# Patient Record
Sex: Female | Born: 1995 | Hispanic: No | Marital: Single | Smoking: Never smoker
Health system: Southern US, Community
[De-identification: ages and names within clinical notes are randomized; demographics above are authoritative.]

---

## 2016-10-13 ENCOUNTER — Emergency Department (HOSPITAL_COMMUNITY): Payer: BLUE CROSS/BLUE SHIELD

## 2016-10-13 ENCOUNTER — Encounter (HOSPITAL_COMMUNITY): Payer: Self-pay | Admitting: Emergency Medicine

## 2016-10-13 ENCOUNTER — Emergency Department (HOSPITAL_COMMUNITY)
Admission: EM | Admit: 2016-10-13 | Discharge: 2016-10-13 | Disposition: A | Discharge status: Home / Self Care | Payer: BLUE CROSS/BLUE SHIELD | Attending: Emergency Medicine | Admitting: Emergency Medicine

## 2016-10-13 DIAGNOSIS — F10120 Alcohol abuse with intoxication, uncomplicated: Secondary | ICD-10-CM | POA: Insufficient documentation

## 2016-10-13 DIAGNOSIS — F10920 Alcohol use, unspecified with intoxication, uncomplicated: Secondary | ICD-10-CM

## 2016-10-13 DIAGNOSIS — Z5181 Encounter for therapeutic drug level monitoring: Secondary | ICD-10-CM | POA: Diagnosis not present

## 2016-10-13 LAB — CBG MONITORING, ED: GLUCOSE-CAPILLARY: 125 mg/dL — AB (ref 65–99)

## 2016-10-13 LAB — CBC WITH DIFFERENTIAL/PLATELET
Basophils Absolute: 0.1 10*3/uL (ref 0.0–0.1)
Basophils Relative: 0 %
EOS ABS: 0.2 10*3/uL (ref 0.0–0.7)
EOS PCT: 1 %
HCT: 38.2 % (ref 36.0–46.0)
Hemoglobin: 13 g/dL (ref 12.0–15.0)
LYMPHS ABS: 3.7 10*3/uL (ref 0.7–4.0)
Lymphocytes Relative: 30 %
MCH: 29.5 pg (ref 26.0–34.0)
MCHC: 34 g/dL (ref 30.0–36.0)
MCV: 86.8 fL (ref 78.0–100.0)
MONO ABS: 0.5 10*3/uL (ref 0.1–1.0)
Monocytes Relative: 4 %
Neutro Abs: 8 10*3/uL — ABNORMAL HIGH (ref 1.7–7.7)
Neutrophils Relative %: 65 %
PLATELETS: 266 10*3/uL (ref 150–400)
RBC: 4.4 MIL/uL (ref 3.87–5.11)
RDW: 12.9 % (ref 11.5–15.5)
WBC: 12.5 10*3/uL — AB (ref 4.0–10.5)

## 2016-10-13 LAB — COMPREHENSIVE METABOLIC PANEL
ALT: 11 U/L — ABNORMAL LOW (ref 14–54)
ANION GAP: 10 (ref 5–15)
AST: 22 U/L (ref 15–41)
Albumin: 4 g/dL (ref 3.5–5.0)
Alkaline Phosphatase: 73 U/L (ref 38–126)
BUN: 9 mg/dL (ref 6–20)
CO2: 21 mmol/L — AB (ref 22–32)
Calcium: 8.5 mg/dL — ABNORMAL LOW (ref 8.9–10.3)
Chloride: 106 mmol/L (ref 101–111)
Creatinine, Ser: 0.52 mg/dL (ref 0.44–1.00)
GFR calc Af Amer: >60 mL/min (ref >60)
GFR calc non Af Amer: >60 mL/min (ref >60)
GLUCOSE: 138 mg/dL — AB (ref 65–99)
POTASSIUM: 3.1 mmol/L — AB (ref 3.5–5.1)
SODIUM: 137 mmol/L (ref 135–145)
TOTAL PROTEIN: 7.2 g/dL (ref 6.5–8.1)
Total Bilirubin: 0.5 mg/dL (ref 0.3–1.2)

## 2016-10-13 LAB — I-STAT CHEM 8, ED
BUN: 7 mg/dL (ref 6–20)
CALCIUM ION: 1 mmol/L — AB (ref 1.15–1.40)
CHLORIDE: 105 mmol/L (ref 101–111)
Creatinine, Ser: 0.7 mg/dL (ref 0.44–1.00)
Glucose, Bld: 140 mg/dL — ABNORMAL HIGH (ref 65–99)
HCT: 39 % (ref 36.0–46.0)
HEMOGLOBIN: 13.3 g/dL (ref 12.0–15.0)
Potassium: 3.1 mmol/L — ABNORMAL LOW (ref 3.5–5.1)
SODIUM: 140 mmol/L (ref 135–145)
TCO2: 22 mmol/L (ref 0–100)

## 2016-10-13 LAB — RAPID URINE DRUG SCREEN, HOSP PERFORMED
Amphetamines: NOT DETECTED
Barbiturates: NOT DETECTED
Benzodiazepines: NOT DETECTED
COCAINE: NOT DETECTED
OPIATES: NOT DETECTED
Tetrahydrocannabinol: NOT DETECTED

## 2016-10-13 LAB — ACETAMINOPHEN LEVEL

## 2016-10-13 LAB — I-STAT BETA HCG BLOOD, ED (MC, WL, AP ONLY): I-stat hCG, quantitative: <5 m[IU]/mL (ref <5)

## 2016-10-13 LAB — ETHANOL: Alcohol, Ethyl (B): 213 mg/dL — ABNORMAL HIGH (ref <5)

## 2016-10-13 LAB — SALICYLATE LEVEL: Salicylate Lvl: <7 mg/dL (ref 2.8–30.0)

## 2016-10-13 MED ORDER — ONDANSETRON HCL 4 MG/2ML IJ SOLN: 4.0000 mg | Freq: Once | INTRAMUSCULAR | Status: DC

## 2016-10-13 MED ORDER — ONDANSETRON HCL 4 MG/2ML IJ SOLN
4.0000 mg | Freq: Once | INTRAMUSCULAR | Status: AC
  Administered 2016-10-13: 4 mg via INTRAVENOUS
  Filled 2016-10-13: qty 2

## 2016-10-13 MED ORDER — SODIUM CHLORIDE 0.9 % IV BOLUS (SEPSIS)
1000.0000 mL | Freq: Once | INTRAVENOUS | Status: AC
  Administered 2016-10-13: 1000 mL via INTRAVENOUS

## 2016-10-13 NOTE — ED Notes (Signed)
Bed: [id number] Expected date:  Expected time:  Means of arrival:  Comments: 20yo ETOH

## 2016-10-13 NOTE — ED Notes (Signed)
Pt ambulated to restroom without assistance.

## 2016-10-13 NOTE — ED Notes (Signed)
Pt has been drinking ice water---- tolerating well.  Pt denies nausea.

## 2016-10-13 NOTE — ED Provider Notes (Signed)
[facility]-EMERGENCY DEPT Provider Note   CSN: [id number] Arrival date & time: 10/13/16  0134  By signing my name below, I, Sheryl Roy, attest that this documentation has been prepared under the direction and in the presence of Sheryl Cercone, MD. Electronically Signed: Doreatha Roy, ED Scribe. 10/13/16. 1:49 AM.     History   Chief Complaint Chief Complaint  Patient presents with  . Alcohol Intoxication   LEVEL 5 CAVEAT: HPI and ROS limited due to AMS  HPI Sheryl Roy is a 21 y.o. female brought in by friend who presents to the Emergency Department d/t alcohol intoxication and AMS that began tonight. Per friend, she picked the pt up at a taco shop tonight after a birthday party after an apartment.  Friend states that the friend has been vomiting since she picked her up. Per friend, the pt likely drank beer and liquor at the party. Illicit substance use is not known.   The history is provided by the patient and a friend. The history is limited by the condition of the patient. No language interpreter was used.  Alcohol Intoxication  This is a new problem. The current episode started 1 to 2 hours ago. The problem occurs constantly. The problem has not changed since onset.Pertinent negatives include no chest pain, no abdominal pain, no headaches and no shortness of breath. Nothing aggravates the symptoms. Nothing relieves the symptoms. She has tried nothing for the symptoms. The treatment provided no relief.    History reviewed. No pertinent past medical history.  There are no active problems to display for this patient.   History reviewed. No pertinent surgical history.  OB History    No data available       Home Medications    Prior to Admission medications   Not on File    Family History History reviewed. No pertinent family history.  Social History Social History  Substance Use Topics  . Smoking status: Never Smoker  . Smokeless tobacco: Never Used  . Alcohol use  Yes     Allergies   Patient has no known allergies.   Review of Systems Review of Systems  Unable to perform ROS: Other  Respiratory: Negative for shortness of breath.   Cardiovascular: Negative for chest pain.  Gastrointestinal: Negative for abdominal pain.  Neurological: Negative for headaches.     Physical Exam Updated Vital Signs There were no vitals taken for this visit.  Physical Exam  Constitutional: She appears well-developed and well-nourished.  Cold peripherally. Somnolent.   HENT:  Head: Normocephalic and atraumatic.  Mouth/Throat: Oropharynx is clear and moist. No oropharyngeal exudate.  Moist mucous membranes. No exudates.   Eyes: Conjunctivae are normal.  Dilated pupils.   Neck: Normal range of motion. Neck supple. No JVD present. No tracheal deviation present.  No carotid bruits. Trachea midline.   Cardiovascular: Normal rate, regular rhythm, normal heart sounds and intact distal pulses.  Exam reveals no gallop and no friction rub.   No murmur heard. RRR.   Pulmonary/Chest: Effort normal and breath sounds normal. No stridor. No respiratory distress. She has no wheezes. She has no rales.  Lungs CTA bilaterally.   Abdominal: Soft. Bowel sounds are normal. She exhibits no distension and no mass. There is no tenderness. There is no rebound and no guarding.  Musculoskeletal: Normal range of motion.  Lymphadenopathy:    She has no cervical adenopathy.  Neurological: She has normal reflexes. She displays normal reflexes. GCS eye subscore is 4. GCS verbal subscore is  3. GCS motor subscore is 6.  Skin: Skin is warm and dry. Capillary refill takes less than 2 seconds.  Psychiatric: She has a normal mood and affect.  Nursing note and vitals reviewed.    ED Treatments / Results   Vitals:   10/13/16 0330 10/13/16 0529  BP: 105/63 110/70  Pulse: 88 84  Resp: 22 18  Temp:  (S) 97.6 F (36.4 C)     COORDINATION OF CARE: 1:42 AM Discussed treatment plan  with friend at bedside which includes labs and she agreed to plan.    Results for orders placed or performed during the hospital encounter of 10/13/16  Comprehensive metabolic panel  Result Value Ref Range   Sodium 137 135 - 145 mmol/L   Potassium 3.1 (L) 3.5 - 5.1 mmol/L   Chloride 106 101 - 111 mmol/L   CO2 21 (L) 22 - 32 mmol/L   Glucose, Bld 138 (H) 65 - 99 mg/dL   BUN 9 6 - 20 mg/dL   Creatinine, Ser 1.910.52 0.44 - 1.00 mg/dL   Calcium 8.5 (L) 8.9 - 10.3 mg/dL   Total Protein 7.2 6.5 - 8.1 g/dL   Albumin 4.0 3.5 - 5.0 g/dL   AST 22 15 - 41 U/L   ALT 11 (L) 14 - 54 U/L   Alkaline Phosphatase 73 38 - 126 U/L   Total Bilirubin 0.5 0.3 - 1.2 mg/dL   GFR calc non Af Amer >60 >60 mL/min   GFR calc Af Amer >60 >60 mL/min   Anion gap 10 5 - 15  Ethanol  Result Value Ref Range   Alcohol, Ethyl (B) 213 (H) <5 mg/dL  Salicylate level  Result Value Ref Range   Salicylate Lvl <7.0 2.8 - 30.0 mg/dL  Acetaminophen level  Result Value Ref Range   Acetaminophen (Tylenol), Serum <10 (L) 10 - 30 ug/mL  CBC with Differential/Platelet  Result Value Ref Range   WBC 12.5 (H) 4.0 - 10.5 K/uL   RBC 4.40 3.87 - 5.11 MIL/uL   Hemoglobin 13.0 12.0 - 15.0 g/dL   HCT 47.838.2 29.536.0 - 62.146.0 %   MCV 86.8 78.0 - 100.0 fL   MCH 29.5 26.0 - 34.0 pg   MCHC 34.0 30.0 - 36.0 g/dL   RDW 30.812.9 65.711.5 - 84.615.5 %   Platelets 266 150 - 400 K/uL   Neutrophils Relative % 65 %   Neutro Abs 8.0 (H) 1.7 - 7.7 K/uL   Lymphocytes Relative 30 %   Lymphs Abs 3.7 0.7 - 4.0 K/uL   Monocytes Relative 4 %   Monocytes Absolute 0.5 0.1 - 1.0 K/uL   Eosinophils Relative 1 %   Eosinophils Absolute 0.2 0.0 - 0.7 K/uL   Basophils Relative 0 %   Basophils Absolute 0.1 0.0 - 0.1 K/uL  Rapid urine drug screen (hospital performed)  Result Value Ref Range   Opiates NONE DETECTED NONE DETECTED   Cocaine NONE DETECTED NONE DETECTED   Benzodiazepines NONE DETECTED NONE DETECTED   Amphetamines NONE DETECTED NONE DETECTED    Tetrahydrocannabinol NONE DETECTED NONE DETECTED   Barbiturates NONE DETECTED NONE DETECTED  CBG monitoring, ED  Result Value Ref Range   Glucose-Capillary 125 (H) 65 - 99 mg/dL  I-Stat beta hCG blood, ED  Result Value Ref Range   I-stat hCG, quantitative <5.0 <5 mIU/mL   Comment 3          I-Stat Chem 8, ED  Result Value Ref Range   Sodium 140 135 -  145 mmol/L   Potassium 3.1 (L) 3.5 - 5.1 mmol/L   Chloride 105 101 - 111 mmol/L   BUN 7 6 - 20 mg/dL   Creatinine, Ser 9.14 0.44 - 1.00 mg/dL   Glucose, Bld 782 (H) 65 - 99 mg/dL   Calcium, Ion 9.56 (L) 1.15 - 1.40 mmol/L   TCO2 22 0 - 100 mmol/L   Hemoglobin 13.3 12.0 - 15.0 g/dL   HCT 21.3 08.6 - 57.8 %   Dg Chest Portable 1 View  Result Date: 10/13/2016 CLINICAL DATA:  Alcohol intoxication, altered mental status, vomiting. EXAM: PORTABLE CHEST 1 VIEW COMPARISON:  None. FINDINGS: The heart size and mediastinal contours are within normal limits. Both lungs are clear. The visualized skeletal structures are unremarkable. IMPRESSION: No active disease. Electronically Signed   By: Burman Nieves M.D.   On: 10/13/2016 02:45    Procedures Procedures (including critical care time)  Medications Ordered in ED Medications  ondansetron (ZOFRAN) injection 4 mg (4 mg Intravenous Not Given 10/13/16 0200)  ondansetron (ZOFRAN) injection 4 mg (4 mg Intravenous Given 10/13/16 0200)  sodium chloride 0.9 % bolus 1,000 mL (0 mLs Intravenous Stopped 10/13/16 0318)      PO challenged and ambulated successfully.   Final Clinical Impressions(s) / ED Diagnoses  Alcohol intoxication: All questions answered to patient's satisfaction. Based on history and exam patient has been appropriately medically screened and emergency conditions excluded. Patient is stable for discharge at this time. Strict return precautions given for any further episodes, persistent fever, weakness or any concerns. New Prescriptions New Prescriptions New Prescriptions   No  medications on file   I personally performed the services described in this documentation, which was scribed in my presence. The recorded information has been reviewed and is accurate.      Cy Blamer, MD 10/13/16 [phone]

## 2016-10-13 NOTE — ED Triage Notes (Signed)
Brought in by EMS from a restaurant place with c/o alcohol intoxication.  Pt has called her friend to pick her up but too intoxicated to be handled by friend.  Pt was lying on the sidewalk and throwing up on EMS' arrival at the scene.  Pt denied use of drugs.  Pt was given Zofran 4 mg IV en route.

## 2016-11-22 ENCOUNTER — Inpatient Hospital Stay (HOSPITAL_COMMUNITY)
Admission: AD | Admit: 2016-11-22 | Discharge: 2016-11-23 | Disposition: A | Discharge status: Home / Self Care | Payer: BLUE CROSS/BLUE SHIELD | Source: Ambulatory Visit | Attending: Obstetrics & Gynecology | Admitting: Obstetrics & Gynecology

## 2016-11-22 ENCOUNTER — Encounter (HOSPITAL_COMMUNITY): Payer: Self-pay | Admitting: *Deleted

## 2016-11-22 DIAGNOSIS — Z87891 Personal history of nicotine dependence: Secondary | ICD-10-CM | POA: Insufficient documentation

## 2016-11-22 DIAGNOSIS — N939 Abnormal uterine and vaginal bleeding, unspecified: Secondary | ICD-10-CM | POA: Insufficient documentation

## 2016-11-22 NOTE — MAU Note (Addendum)
PT  SAYS    NOT  PREG  -  HEAVY BLEEDING - STARTED    AT 11PM.   IN TRIAGE  - PAD  SOAKED.    FIRST   TIME    HAD  SEX  WAS LAST Friday -    NO BIRTH  CONTROL-  USED  CONDOM.

## 2016-11-23 ENCOUNTER — Inpatient Hospital Stay (HOSPITAL_COMMUNITY): Payer: BLUE CROSS/BLUE SHIELD

## 2016-11-23 DIAGNOSIS — Z87891 Personal history of nicotine dependence: Secondary | ICD-10-CM | POA: Diagnosis not present

## 2016-11-23 DIAGNOSIS — N939 Abnormal uterine and vaginal bleeding, unspecified: Secondary | ICD-10-CM | POA: Diagnosis not present

## 2016-11-23 LAB — WET PREP, GENITAL
Clue Cells Wet Prep HPF POC: NONE SEEN
Sperm: NONE SEEN
TRICH WET PREP: NONE SEEN
WBC, Wet Prep HPF POC: NONE SEEN
YEAST WET PREP: NONE SEEN

## 2016-11-23 LAB — CBC
HEMATOCRIT: 39.2 % (ref 36.0–46.0)
Hemoglobin: 13.3 g/dL (ref 12.0–15.0)
MCH: 29.4 pg (ref 26.0–34.0)
MCHC: 33.9 g/dL (ref 30.0–36.0)
MCV: 86.7 fL (ref 78.0–100.0)
Platelets: 295 10*3/uL (ref 150–400)
RBC: 4.52 MIL/uL (ref 3.87–5.11)
RDW: 12.6 % (ref 11.5–15.5)
WBC: 6.2 10*3/uL (ref 4.0–10.5)

## 2016-11-23 LAB — HCG, SERUM, QUALITATIVE: Preg, Serum: NEGATIVE

## 2016-11-23 NOTE — Discharge Instructions (Signed)

## 2016-11-23 NOTE — MAU Provider Note (Signed)
History     CSN: [id number]  Arrival date and time: 11/22/16 2320   First Provider Initiated Contact with Patient 11/22/16 2350      Chief Complaint  Patient presents with  . Vaginal Bleeding   Sheryl Roy is a 21 y.o. G0P0000 who presents today with vaginal bleeding. She states that she had a normal period on 11/08/16. She had intercourse for the first time on 11/16/16. She had some bleeding after intercourse, but it stopped by the following morning. The this evening around 2300 she started to have heavy bleeding. She denies any pain at this time.    Vaginal Bleeding  The patient's primary symptoms include vaginal bleeding. The patient's pertinent negatives include no pelvic pain. This is a new problem. The current episode started today. The problem occurs constantly. The problem has been unchanged. The patient is experiencing no pain. Pertinent negatives include no chills, dysuria, fever, nausea, urgency or vomiting. The vaginal bleeding is heavier than menses. She has been passing clots. She has not been passing tissue. The symptoms are aggravated by intercourse. She has tried nothing for the symptoms. She uses condoms for contraception. Her menstrual history has been regular (11/08/16 ).   History reviewed. No pertinent past medical history.  History reviewed. No pertinent surgical history.  History reviewed. No pertinent family history.  Social History  Substance Use Topics  . Smoking status: Former Games developer  . Smokeless tobacco: Never Used  . Alcohol use Yes     Comment:  ALL SOMETIMES    Allergies: No Known Allergies  No prescriptions prior to admission.    Review of Systems  Constitutional: Negative for chills and fever.  Gastrointestinal: Negative for nausea and vomiting.  Genitourinary: Positive for vaginal bleeding. Negative for dysuria, pelvic pain, urgency and vaginal pain.   Physical Exam   Blood pressure 115/80, pulse 100, temperature 97.7 F (36.5 C),  temperature source Oral, resp. rate 18, weight 108 lb 4 oz (49.1 kg), last menstrual period 11/08/2016.  Physical Exam  Nursing note and vitals reviewed. Constitutional: She is oriented to person, place, and time. She appears well-developed and well-nourished. No distress.  HENT:  Head: Normocephalic.  Cardiovascular: Normal rate.   Respiratory: Effort normal.  GI: Soft. There is no tenderness. There is no rebound.  Genitourinary:  Genitourinary Comments:  External: no lesion Vagina: bright red blood in the vagina.  No palpable defect in the posterior fornix.  Cervix: pink, smooth, no CMT, no active bleeding seen from os.  Uterus: NSSC Adnexa: NT   Neurological: She is alert and oriented to person, place, and time.  Skin: Skin is warm and dry.  Psychiatric: She has a normal mood and affect.   Results for orders placed or performed during the hospital encounter of 11/22/16 (from the past 24 hour(s))  Wet prep, genital     Status: None   Collection Time: 11/23/16 12:00 AM  Result Value Ref Range   Yeast Wet Prep HPF POC NONE SEEN NONE SEEN   Trich, Wet Prep NONE SEEN NONE SEEN   Clue Cells Wet Prep HPF POC NONE SEEN NONE SEEN   WBC, Wet Prep HPF POC NONE SEEN NONE SEEN   Sperm NONE SEEN   CBC     Status: None   Collection Time: 11/23/16 12:04 AM  Result Value Ref Range   WBC 6.2 4.0 - 10.5 K/uL   RBC 4.52 3.87 - 5.11 MIL/uL   Hemoglobin 13.3 12.0 - 15.0 g/dL   HCT 39.2  36.0 - 46.0 %   MCV 86.7 78.0 - 100.0 fL   MCH 29.4 26.0 - 34.0 pg   MCHC 33.9 30.0 - 36.0 g/dL   RDW 16.112.6 09.611.5 - 04.515.5 %   Platelets 295 150 - 400 K/uL  hCG, serum, qualitative     Status: None   Collection Time: 11/23/16 12:04 AM  Result Value Ref Range   Preg, Serum NEGATIVE NEGATIVE   [place] Transvaginal Non-ob  Result Date: 11/23/2016 CLINICAL DATA:  Acute onset of heavy vaginal bleeding. Initial encounter. EXAM: TRANSABDOMINAL AND TRANSVAGINAL ULTRASOUND OF PELVIS TECHNIQUE: Both transabdominal and  transvaginal ultrasound examinations of the pelvis were performed. Transabdominal technique was performed for global imaging of the pelvis including uterus, ovaries, adnexal regions, and pelvic cul-de-sac. It was necessary to proceed with endovaginal exam following the transabdominal exam to visualize the uterus and ovaries in greater detail. COMPARISON:  None FINDINGS: Uterus Measurements: 5.8 x 3.0 x 4.0 cm. No fibroids or other mass visualized. Endometrium Thickness: 1.3 cm.  No focal abnormality visualized. Right ovary Measurements: 3.5 x 1.8 x 1.7 cm. Normal appearance/no adnexal mass. Left ovary Measurements: 2.2 x 1.6 x 2.2 cm. Normal appearance/no adnexal mass. Other findings Trace free fluid is seen within the pelvic cul-de-sac. IMPRESSION: Unremarkable pelvic ultrasound.  No evidence for ovarian torsion. Electronically Signed   By: Roanna RaiderJeffery  Chang M.D.   On: 11/23/2016 01:17   [place] Pelvis Complete  Result Date: 11/23/2016 CLINICAL DATA:  Acute onset of heavy vaginal bleeding. Initial encounter. EXAM: TRANSABDOMINAL AND TRANSVAGINAL ULTRASOUND OF PELVIS TECHNIQUE: Both transabdominal and transvaginal ultrasound examinations of the pelvis were performed. Transabdominal technique was performed for global imaging of the pelvis including uterus, ovaries, adnexal regions, and pelvic cul-de-sac. It was necessary to proceed with endovaginal exam following the transabdominal exam to visualize the uterus and ovaries in greater detail. COMPARISON:  None FINDINGS: Uterus Measurements: 5.8 x 3.0 x 4.0 cm. No fibroids or other mass visualized. Endometrium Thickness: 1.3 cm.  No focal abnormality visualized. Right ovary Measurements: 3.5 x 1.8 x 1.7 cm. Normal appearance/no adnexal mass. Left ovary Measurements: 2.2 x 1.6 x 2.2 cm. Normal appearance/no adnexal mass. Other findings Trace free fluid is seen within the pelvic cul-de-sac. IMPRESSION: Unremarkable pelvic ultrasound.  No evidence for ovarian torsion.  Electronically Signed   By: Roanna RaiderJeffery  Chang M.D.   On: 11/23/2016 01:17   MAU Course  Procedures  MDM   Assessment and Plan   1. Abnormal uterine bleeding   2. Vagina bleeding    DC home Comfort measures reviewed  Bleeding precautions RX: none  Return to MAU as needed FU with OB as planned  Follow-up Information    [facility]   Specialty:  Obstetrics and Gynecology Why:  They will call you with an appointment  Contact information: [address] [place] [phone]           Tawnya CrookHogan, Heather Donovan 11/23/2016, 12:02 AM

## 2016-11-26 LAB — GC/CHLAMYDIA PROBE AMP (~~LOC~~) NOT AT ARMC
CHLAMYDIA, DNA PROBE: NEGATIVE
Neisseria Gonorrhea: NEGATIVE

## 2016-11-27 ENCOUNTER — Encounter: Payer: Self-pay | Admitting: General Practice

## 2016-12-13 ENCOUNTER — Encounter: Payer: BLUE CROSS/BLUE SHIELD | Admitting: Obstetrics & Gynecology

## 2019-11-30 ENCOUNTER — Encounter (HOSPITAL_COMMUNITY): Payer: Self-pay

## 2019-11-30 ENCOUNTER — Other Ambulatory Visit: Payer: Self-pay

## 2019-11-30 ENCOUNTER — Ambulatory Visit (INDEPENDENT_AMBULATORY_CARE_PROVIDER_SITE_OTHER): Payer: BLUE CROSS/BLUE SHIELD

## 2019-11-30 ENCOUNTER — Ambulatory Visit (HOSPITAL_COMMUNITY)
Admission: EM | Admit: 2019-11-30 | Discharge: 2019-11-30 | Disposition: A | Discharge status: Home / Self Care | Payer: BLUE CROSS/BLUE SHIELD | Attending: Family Medicine | Admitting: Family Medicine

## 2019-11-30 DIAGNOSIS — M25522 Pain in left elbow: Secondary | ICD-10-CM

## 2019-11-30 DIAGNOSIS — S52022A Displaced fracture of olecranon process without intraarticular extension of left ulna, initial encounter for closed fracture: Secondary | ICD-10-CM | POA: Diagnosis not present

## 2019-11-30 NOTE — ED Provider Notes (Signed)
[facility]   [id number] 11/30/19 Arrival Time: 6948  ASSESSMENT & PLAN:  1. Olecranon fracture, left, closed, initial encounter     I have personally viewed the imaging studies ordered this visit. Displaced olecranon fracture.  Without neurological or vascular compromise. Spoke with Dr. Percell Miller. Will see her now in office. Placed in sling. Declines analgesics.   Orders Placed This Encounter  Procedures  . DG ELBOW COMPLETE LEFT (3+VIEW)  . Sling immobilizer    Follow-up Information    Go to  Renette Butters, MD.   Specialty: Orthopedic Surgery Contact information: [address] [place] [address] [phone]           Reviewed expectations re: course of current medical issues. Questions answered. Outlined signs and symptoms indicating need for more acute intervention. Patient verbalized understanding. After Visit Summary given.  SUBJECTIVE: History from: patient. Sheryl Roy is a 24 y.o. female who reports persistent mild moderate pain of her left elbow; described as aching; without radiation. Onset: abrupt. First noted: approx 48 hours ago. Reports falling onto her elbow with immediate discomfort that has not worsened. Aggravating factors: certain movements. Alleviating factors: rest. Associated symptoms: none reported. Extremity sensation changes or weakness: none. Self treatment: has not required analgesics.  History of similar: no.  History reviewed. No pertinent surgical history.    OBJECTIVE:  Vitals:   11/30/19 1141 11/30/19 1143  BP:  (!) 90/50  Pulse:  83  Resp:  14  Temp:  98.2 F (36.8 C)  TempSrc:  Oral  SpO2:  100%  Weight: 43.5 kg     General appearance: alert; no distress HEENT: [place]; AT Neck: supple with FROM Resp: unlabored respirations Extremities: . LUE: warm with well perfused appearance; poorly localized mild to moderate tenderness over posterior left elbow; not able to fully extend  elbow secondary to discomfort; without gross deformities; swelling: moderate; bruising: none CV: 2+ radial pulse of LUE. Skin: warm and dry; no visible rashes Neurologic: gait normal; normal sensation of LUE Psychological: alert and cooperative; normal mood and affect  Imaging: DG ELBOW COMPLETE LEFT (3+VIEW)  Result Date: 11/30/2019 CLINICAL DATA:  [place] 2 days ago.  Pain and swelling. EXAM: LEFT ELBOW - COMPLETE 3+ VIEW COMPARISON:  None. FINDINGS: Comminuted fracture of the olecranon process of the ulna. Main fragment is distracted about 1 cm. No visible fracture of the radius or humerus. IMPRESSION: Comminuted fracture of the olecranon process of the ulna with distraction. Electronically Signed   By: Nelson Chimes M.D.   On: 11/30/2019 12:00    No Known Allergies  History reviewed. No pertinent past medical history.   Social History   Socioeconomic History  . Marital status: Single    Spouse name: Not on file  . Number of children: Not on file  . Years of education: Not on file  . Highest education level: Not on file  Occupational History  . Not on file  Tobacco Use  . Smoking status: Never Smoker  . Smokeless tobacco: Never Used  Substance and Sexual Activity  . Alcohol use: Not Currently    Comment:  ALL SOMETIMES  . Drug use: Yes    Types: Marijuana    Comment: last time 10/17/2016  . Sexual activity: Yes    Birth control/protection: Condom  Other Topics Concern  . Not on file  Social History Narrative  . Not on file   Social Determinants of Health   Financial Resource Strain:   . Difficulty  of Paying Living Expenses:   Food Insecurity:   . Worried About Programme researcher, broadcasting/film/video in the Last Year:   . Barista in the Last Year:   Transportation Needs:   . Freight forwarder (Medical):   Marland Kitchen Lack of Transportation (Non-Medical):   Physical Activity:   . Days of Exercise per Week:   . Minutes of Exercise per Session:   Stress:   . Feeling of Stress :     Social Connections:   . Frequency of Communication with Friends and Family:   . Frequency of Social Gatherings with Friends and Family:   . Attends Religious Services:   . Active Member of Clubs or Organizations:   . Attends Banker Meetings:   Marland Kitchen Marital Status:    No family history on file. History reviewed. No pertinent surgical history.    Mardella Layman, MD 11/30/19 [phone]

## 2019-11-30 NOTE — ED Triage Notes (Signed)
Patient reports she fell Saturday evening and caught herself. Since then, she is having elbow and arm pain in her left arm. She notes there is also some swelling.

## 2020-08-21 IMAGING — DX DG ELBOW COMPLETE 3+V*L*
4 series · 4 of 4 positions shown · non-contrast
Comparison: None.

CLINICAL DATA: Fell 2 days ago.  Pain and swelling.

EXAM:
LEFT ELBOW - COMPLETE 3+ VIEW

[elbow ap]
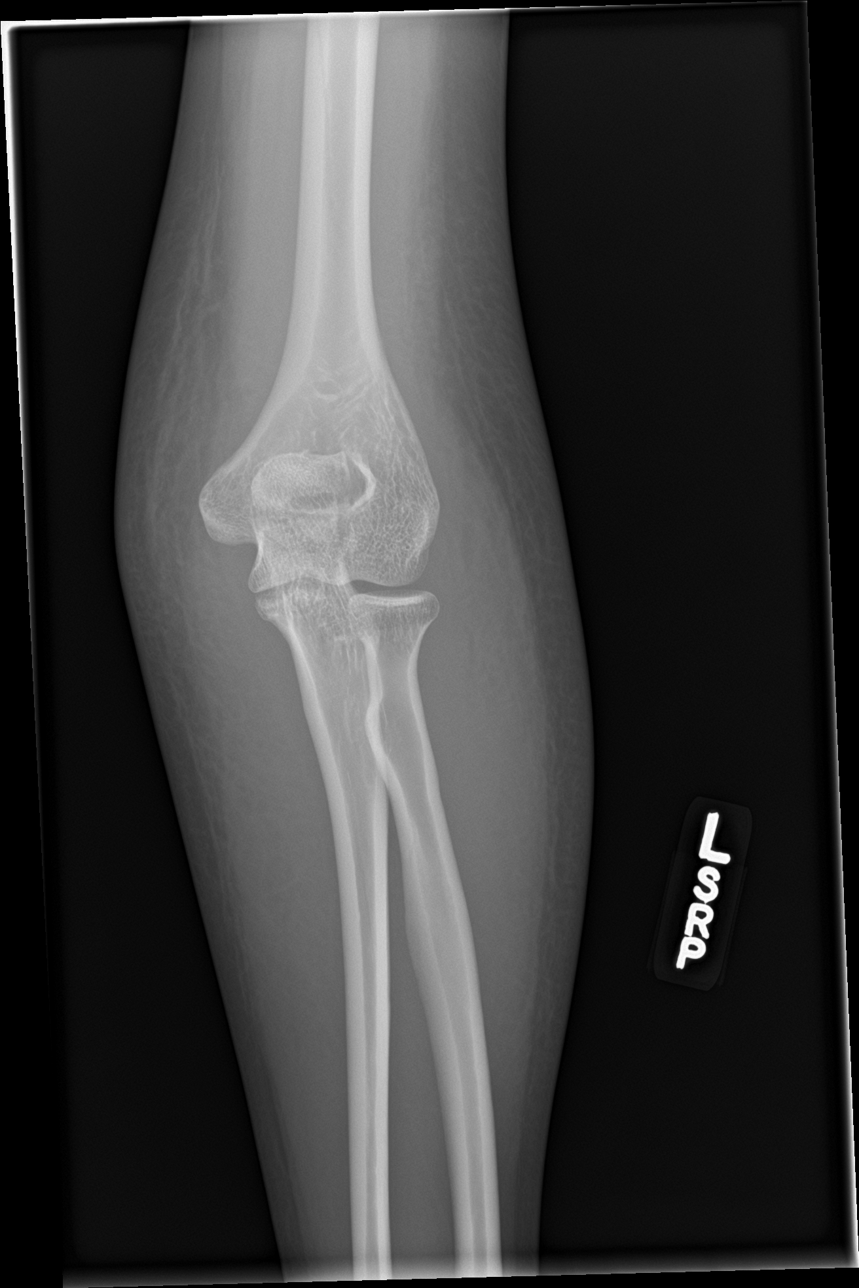

[elbow obl (1 of 2)]
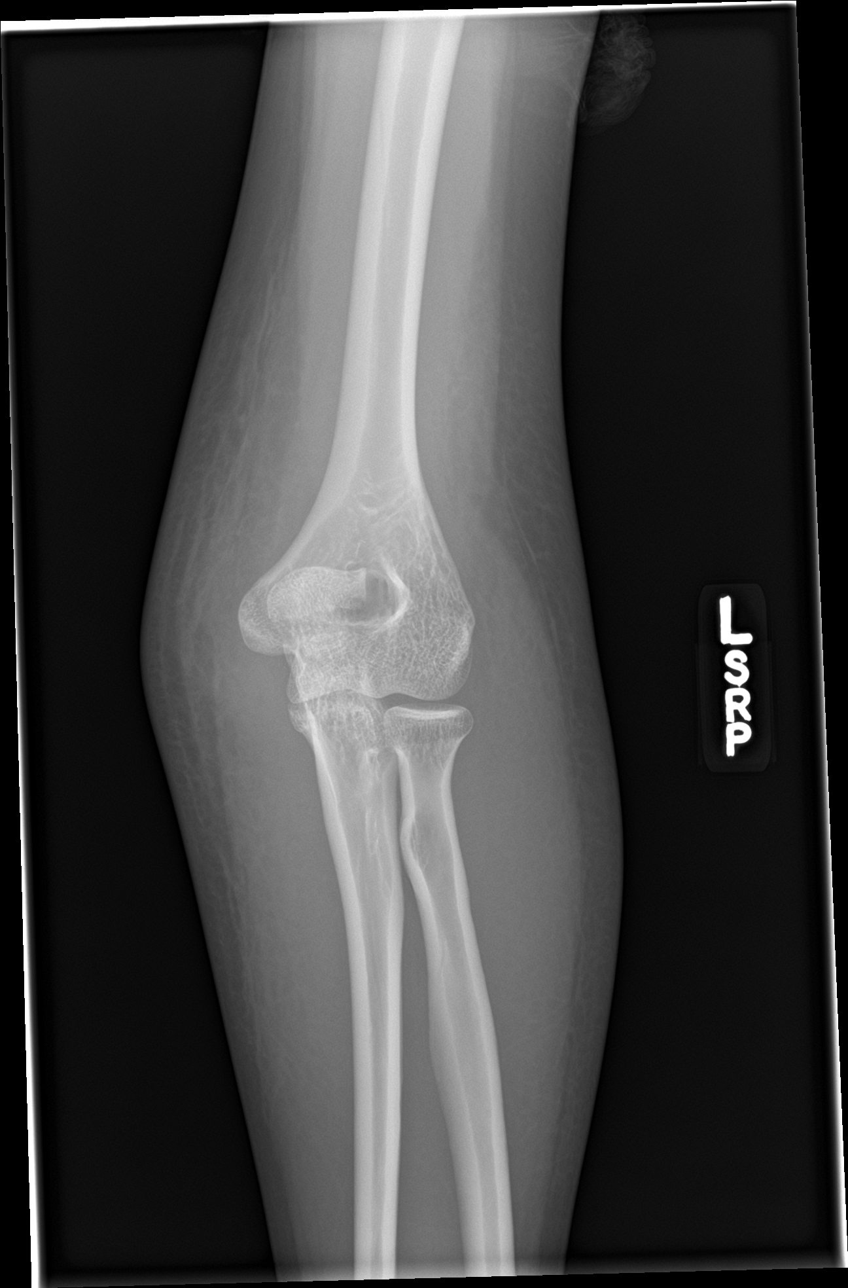

[elbow obl (2 of 2)]
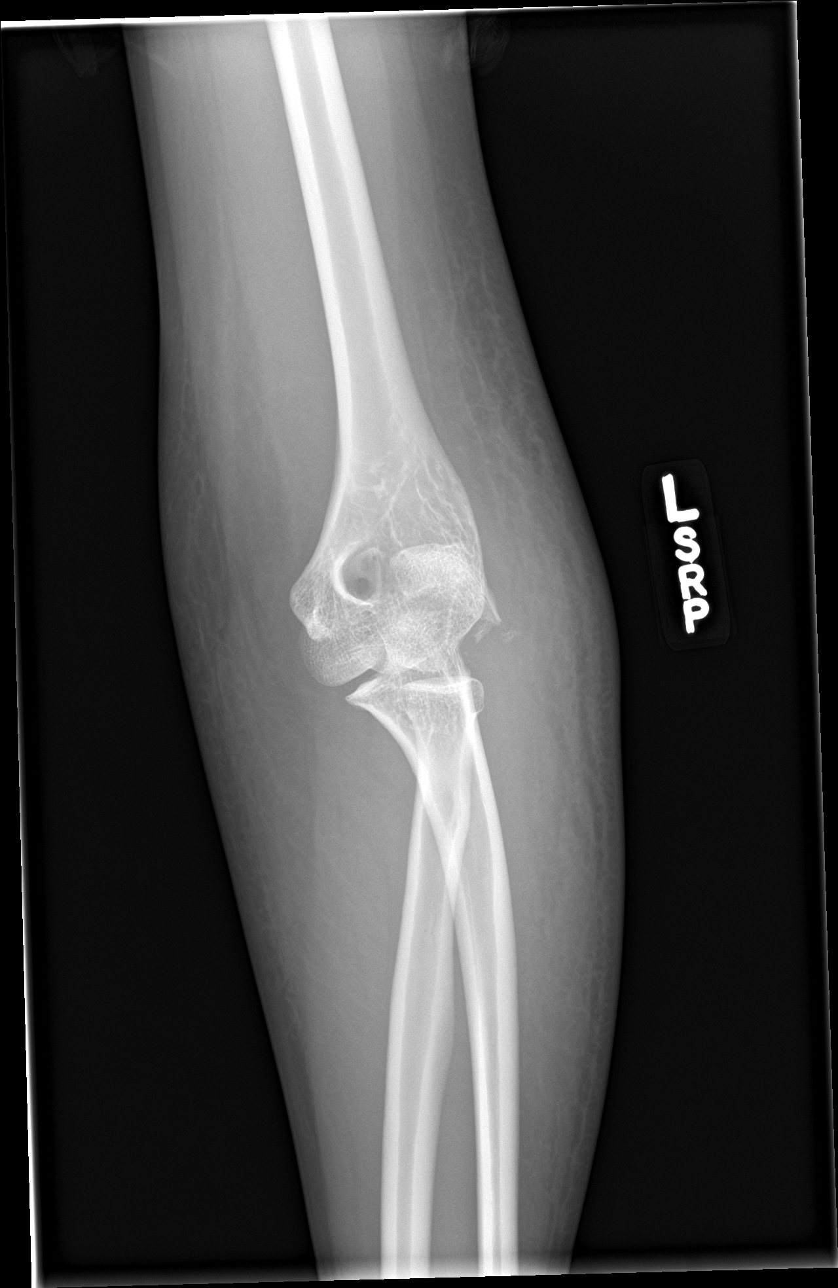

[elbow lat]
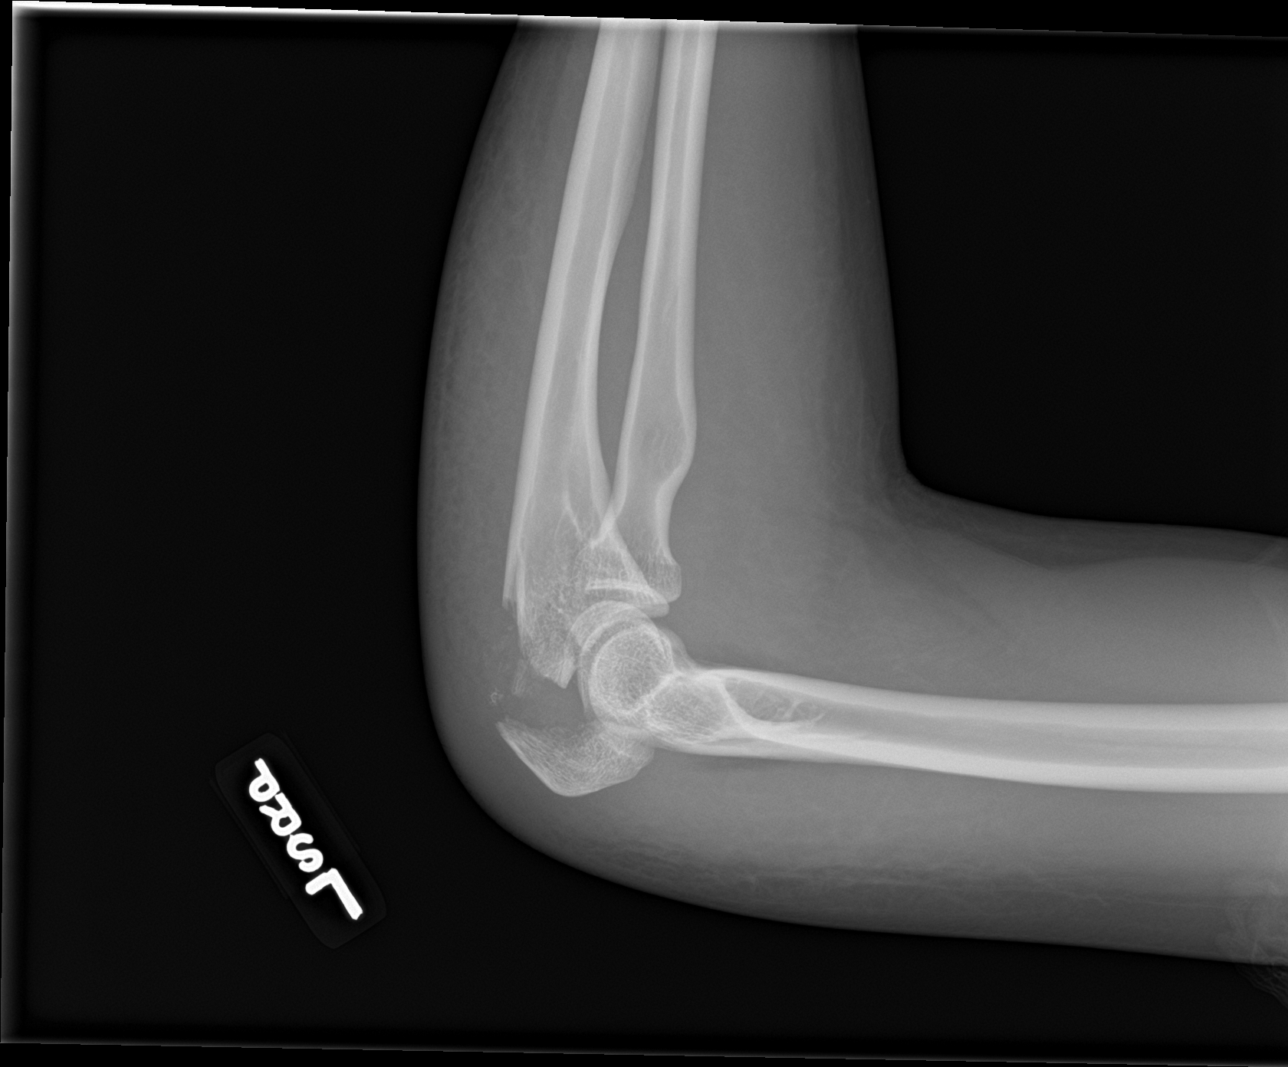

[4 of 4 positions shown; findings below may reference images not displayed]

FINDINGS: Comminuted fracture of the olecranon process of the ulna. Main
fragment is distracted about 1 cm. No visible fracture of the radius
or humerus.
IMPRESSION: Comminuted fracture of the olecranon process of the ulna with
distraction.
